# Patient Record
Sex: Male | Born: 1995 | Race: Black or African American | Hispanic: No | Marital: Single | Smoking: Current every day smoker
Health system: Southern US, Community
[De-identification: ages and names within clinical notes are randomized; demographics above are authoritative.]

---

## 2019-06-26 ENCOUNTER — Emergency Department (HOSPITAL_COMMUNITY)
Admission: EM | Admit: 2019-06-26 | Discharge: 2019-06-26 | Disposition: A | Discharge status: Other Acute Inpatient Hospital | Payer: BC Managed Care – PPO | Attending: Emergency Medicine | Admitting: Emergency Medicine

## 2019-06-26 ENCOUNTER — Emergency Department (HOSPITAL_COMMUNITY): Payer: BC Managed Care – PPO

## 2019-06-26 ENCOUNTER — Encounter (HOSPITAL_COMMUNITY): Payer: Self-pay | Admitting: Licensed Clinical Social Worker

## 2019-06-26 ENCOUNTER — Other Ambulatory Visit: Payer: Self-pay

## 2019-06-26 DIAGNOSIS — W260XXA Contact with knife, initial encounter: Secondary | ICD-10-CM | POA: Insufficient documentation

## 2019-06-26 DIAGNOSIS — R1032 Left lower quadrant pain: Secondary | ICD-10-CM | POA: Insufficient documentation

## 2019-06-26 DIAGNOSIS — Y929 Unspecified place or not applicable: Secondary | ICD-10-CM | POA: Diagnosis not present

## 2019-06-26 DIAGNOSIS — Y999 Unspecified external cause status: Secondary | ICD-10-CM | POA: Diagnosis not present

## 2019-06-26 DIAGNOSIS — F1010 Alcohol abuse, uncomplicated: Secondary | ICD-10-CM | POA: Insufficient documentation

## 2019-06-26 DIAGNOSIS — T1491XA Suicide attempt, initial encounter: Secondary | ICD-10-CM

## 2019-06-26 DIAGNOSIS — F333 Major depressive disorder, recurrent, severe with psychotic symptoms: Secondary | ICD-10-CM | POA: Insufficient documentation

## 2019-06-26 DIAGNOSIS — F17210 Nicotine dependence, cigarettes, uncomplicated: Secondary | ICD-10-CM | POA: Insufficient documentation

## 2019-06-26 DIAGNOSIS — F329 Major depressive disorder, single episode, unspecified: Secondary | ICD-10-CM | POA: Diagnosis present

## 2019-06-26 DIAGNOSIS — S51812A Laceration without foreign body of left forearm, initial encounter: Secondary | ICD-10-CM | POA: Diagnosis not present

## 2019-06-26 DIAGNOSIS — R45851 Suicidal ideations: Secondary | ICD-10-CM | POA: Insufficient documentation

## 2019-06-26 DIAGNOSIS — S41112A Laceration without foreign body of left upper arm, initial encounter: Secondary | ICD-10-CM

## 2019-06-26 DIAGNOSIS — Y939 Activity, unspecified: Secondary | ICD-10-CM | POA: Diagnosis not present

## 2019-06-26 DIAGNOSIS — Z20822 Contact with and (suspected) exposure to covid-19: Secondary | ICD-10-CM | POA: Diagnosis not present

## 2019-06-26 LAB — COMPREHENSIVE METABOLIC PANEL
ALT: 27 U/L (ref 0–44)
AST: 25 U/L (ref 15–41)
Albumin: 3.8 g/dL (ref 3.5–5.0)
Alkaline Phosphatase: 80 U/L (ref 38–126)
Anion gap: 9 (ref 5–15)
BUN: 12 mg/dL (ref 6–20)
CO2: 22 mmol/L (ref 22–32)
Calcium: 9.3 mg/dL (ref 8.9–10.3)
Chloride: 106 mmol/L (ref 98–111)
Creatinine, Ser: 1.26 mg/dL — ABNORMAL HIGH (ref 0.61–1.24)
GFR calc Af Amer: >60 mL/min (ref >60)
GFR calc non Af Amer: >60 mL/min (ref >60)
Glucose, Bld: 137 mg/dL — ABNORMAL HIGH (ref 70–99)
Potassium: 4.1 mmol/L (ref 3.5–5.1)
Sodium: 137 mmol/L (ref 135–145)
Total Bilirubin: 0.4 mg/dL (ref 0.3–1.2)
Total Protein: 7 g/dL (ref 6.5–8.1)

## 2019-06-26 LAB — I-STAT CHEM 8, ED
BUN: 14 mg/dL (ref 6–20)
Calcium, Ion: 1.13 mmol/L — ABNORMAL LOW (ref 1.15–1.40)
Chloride: 106 mmol/L (ref 98–111)
Creatinine, Ser: 1.1 mg/dL (ref 0.61–1.24)
Glucose, Bld: 130 mg/dL — ABNORMAL HIGH (ref 70–99)
HCT: 42 % (ref 39.0–52.0)
Hemoglobin: 14.3 g/dL (ref 13.0–17.0)
Potassium: 3.9 mmol/L (ref 3.5–5.1)
Sodium: 140 mmol/L (ref 135–145)
TCO2: 25 mmol/L (ref 22–32)

## 2019-06-26 LAB — RAPID URINE DRUG SCREEN, HOSP PERFORMED
Amphetamines: NOT DETECTED
Barbiturates: NOT DETECTED
Benzodiazepines: NOT DETECTED
Cocaine: NOT DETECTED
Opiates: NOT DETECTED
Tetrahydrocannabinol: NOT DETECTED

## 2019-06-26 LAB — LACTIC ACID, PLASMA: Lactic Acid, Venous: 1.7 mmol/L (ref 0.5–1.9)

## 2019-06-26 LAB — URINALYSIS, ROUTINE W REFLEX MICROSCOPIC
Bacteria, UA: NONE SEEN
Bilirubin Urine: NEGATIVE
Glucose, UA: NEGATIVE mg/dL
Ketones, ur: NEGATIVE mg/dL
Leukocytes,Ua: NEGATIVE
Nitrite: NEGATIVE
Protein, ur: NEGATIVE mg/dL
Specific Gravity, Urine: 1.008 (ref 1.005–1.030)
pH: 6 (ref 5.0–8.0)

## 2019-06-26 LAB — CBC
HCT: 42.5 % (ref 39.0–52.0)
Hemoglobin: 13.4 g/dL (ref 13.0–17.0)
MCH: 26.8 pg (ref 26.0–34.0)
MCHC: 31.5 g/dL (ref 30.0–36.0)
MCV: 85 fL (ref 80.0–100.0)
Platelets: 318 10*3/uL (ref 150–400)
RBC: 5 MIL/uL (ref 4.22–5.81)
RDW: 14 % (ref 11.5–15.5)
WBC: 8.4 10*3/uL (ref 4.0–10.5)
nRBC: 0 % (ref 0.0–0.2)

## 2019-06-26 LAB — SAMPLE TO BLOOD BANK

## 2019-06-26 LAB — RESPIRATORY PANEL BY RT PCR (FLU A&B, COVID)
Influenza A by PCR: NEGATIVE
Influenza B by PCR: NEGATIVE
SARS Coronavirus 2 by RT PCR: NEGATIVE

## 2019-06-26 LAB — PROTIME-INR
INR: 1.1 (ref 0.8–1.2)
Prothrombin Time: 13.8 seconds (ref 11.4–15.2)

## 2019-06-26 LAB — ETHANOL: Alcohol, Ethyl (B): <10 mg/dL (ref <10)

## 2019-06-26 LAB — CDS SEROLOGY

## 2019-06-26 MED ORDER — FENTANYL CITRATE (PF) 100 MCG/2ML IJ SOLN
50.0000 ug | Freq: Once | INTRAMUSCULAR | Status: AC
  Administered 2019-06-26: 50 ug via INTRAVENOUS
  Filled 2019-06-26: qty 2

## 2019-06-26 MED ORDER — SODIUM CHLORIDE 0.9 % IV BOLUS
1000.0000 mL | Freq: Once | INTRAVENOUS | Status: AC
  Administered 2019-06-26: 07:00:00 1000 mL via INTRAVENOUS

## 2019-06-26 MED ORDER — IOHEXOL 300 MG/ML  SOLN
100.0000 mL | Freq: Once | INTRAMUSCULAR | Status: AC | PRN
  Administered 2019-06-26: 07:00:00 100 mL via INTRAVENOUS

## 2019-06-26 MED ORDER — LIDOCAINE-EPINEPHRINE (PF) 2 %-1:200000 IJ SOLN
10.0000 mL | Freq: Once | INTRAMUSCULAR | Status: AC
  Administered 2019-06-26: 10 mL via INTRADERMAL
  Filled 2019-06-26: qty 20

## 2019-06-26 MED ORDER — OXYCODONE-ACETAMINOPHEN 5-325 MG PO TABS: 1.0000 | ORAL_TABLET | Freq: Four times a day (QID) | ORAL | Status: DC | PRN

## 2019-06-26 MED ORDER — SODIUM CHLORIDE 0.9 % IV BOLUS
1000.0000 mL | Freq: Once | INTRAVENOUS | Status: AC
  Administered 2019-06-26: 06:00:00 1000 mL via INTRAVENOUS

## 2019-06-26 MED ORDER — IBUPROFEN 400 MG PO TABS: 600.0000 mg | ORAL_TABLET | Freq: Four times a day (QID) | ORAL | Status: DC | PRN

## 2019-06-26 MED ORDER — SODIUM CHLORIDE 0.9 % IV SOLN: INTRAVENOUS | Status: DC

## 2019-06-26 MED ORDER — KETOROLAC TROMETHAMINE 15 MG/ML IJ SOLN
15.0000 mg | Freq: Once | INTRAMUSCULAR | Status: AC
  Administered 2019-06-26: 08:00:00 15 mg via INTRAVENOUS
  Filled 2019-06-26: qty 1

## 2019-06-26 NOTE — ED Notes (Signed)
Pt father came by, informed father of policy and he said he understands. Father is aware to what is going on with son at this time as he stated he has spoken with the nurse taking care of son he just want to let them know he is outside in vehicle.

## 2019-06-26 NOTE — ED Notes (Signed)
Left 2nd message for [facility] Deputy notifying of need for transport.

## 2019-06-26 NOTE — ED Notes (Signed)
Pt ambulatory to nurses' desk talking to father on phone.
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
## 2019-06-26 NOTE — ED Notes (Signed)
Pt only tolerate 67ft of ambulation at the time due lower pain.

## 2019-06-26 NOTE — Progress Notes (Signed)
Pt accepted to [facility] Dr. Estill Cotta is the accepting provider.  Call report to [phone] Kriste Basque, RN @ [facility] ED notified.   Pt is IVC Pt may be transported by [facility] Dept. Pt scheduled to arrive at [facility] main campus once transport has been set up.   Ruthann Cancer MSW, [facility] Clincal Social Worker Disposition  [facility] Ph: [phone] Fax: [phone]  06/26/2019 10:49 AM

## 2019-06-26 NOTE — ED Notes (Signed)
Aaron Gill (dad) [phone]

## 2019-06-26 NOTE — ED Notes (Signed)
Message sent to Dr Fredderick Phenix for EMTALA - Deputy called advised will be here to transport pt in approx 30 minutes.
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
## 2019-06-26 NOTE — ED Notes (Signed)
Dr Charm Barges completed EMTALA. Deputy here to transport pt - ALL belongings - 1 labeled belongings bag - Deputy - Pt aware.

## 2019-06-26 NOTE — ED Notes (Signed)
Deputy called and advised received message and will call when approx 30 minutes away.
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
## 2019-06-26 NOTE — ED Notes (Signed)
Pt arrived to Rm [facility] via w/c. Pt ambulated to bed. TTS being performed.
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
## 2019-06-26 NOTE — Progress Notes (Signed)
Patient meets criteria for inpatient treatment per Hillery Jacks, NP. No appropriate beds at [facility] currently. CSW faxed referrals to the following facilities for review:  [facility]  [facility]  [facility]  [facility]   [facility]  [facility]   [facility]   [facility]   TTS will continue to seek bed placement.     Ruthann Cancer MSW, [facility] Clincal Social Worker Disposition  [facility] Ph: [phone] Fax: [phone] 06/26/2019 10:33 AM

## 2019-06-26 NOTE — ED Notes (Signed)
Pt transported to CT ?

## 2019-06-26 NOTE — ED Notes (Signed)
Update given to pts dad, verbal consent given by pt. RN informed pts dad about visiting policy but states he still wants to come and wait outside.
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
